# Patient Record
Sex: Female | Born: 1980 | Race: White | Hispanic: No | Marital: Married | Smoking: Never smoker
Health system: Southern US, Community
[De-identification: ages and names within clinical notes are randomized; demographics above are authoritative.]

## PROBLEM LIST (undated history)

## (undated) DIAGNOSIS — E109 Type 1 diabetes mellitus without complications: Secondary | ICD-10-CM

## (undated) HISTORY — DX: Type 1 diabetes mellitus without complications: E10.9

---
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
## 2016-10-27 ENCOUNTER — Inpatient Hospital Stay (HOSPITAL_COMMUNITY)
Admission: EM | Admit: 2016-10-27 | Discharge: 2016-10-30 | DRG: 638 | Disposition: A | Discharge status: Home / Self Care | Payer: 59 | Attending: Pulmonary Disease | Admitting: Pulmonary Disease

## 2016-10-27 ENCOUNTER — Emergency Department (HOSPITAL_COMMUNITY): Payer: 59

## 2016-10-27 DIAGNOSIS — D72829 Elevated white blood cell count, unspecified: Secondary | ICD-10-CM | POA: Diagnosis present

## 2016-10-27 DIAGNOSIS — E876 Hypokalemia: Secondary | ICD-10-CM | POA: Diagnosis present

## 2016-10-27 DIAGNOSIS — J9811 Atelectasis: Secondary | ICD-10-CM | POA: Diagnosis present

## 2016-10-27 DIAGNOSIS — E875 Hyperkalemia: Secondary | ICD-10-CM | POA: Diagnosis present

## 2016-10-27 DIAGNOSIS — E1110 Type 2 diabetes mellitus with ketoacidosis without coma: Secondary | ICD-10-CM | POA: Diagnosis present

## 2016-10-27 DIAGNOSIS — B9729 Other coronavirus as the cause of diseases classified elsewhere: Secondary | ICD-10-CM | POA: Diagnosis present

## 2016-10-27 DIAGNOSIS — R112 Nausea with vomiting, unspecified: Secondary | ICD-10-CM | POA: Diagnosis present

## 2016-10-27 DIAGNOSIS — N179 Acute kidney failure, unspecified: Secondary | ICD-10-CM | POA: Diagnosis present

## 2016-10-27 DIAGNOSIS — Z794 Long term (current) use of insulin: Secondary | ICD-10-CM | POA: Diagnosis not present

## 2016-10-27 DIAGNOSIS — R000 Tachycardia, unspecified: Secondary | ICD-10-CM | POA: Diagnosis present

## 2016-10-27 DIAGNOSIS — E1010 Type 1 diabetes mellitus with ketoacidosis without coma: Principal | ICD-10-CM | POA: Diagnosis present

## 2016-10-27 DIAGNOSIS — E861 Hypovolemia: Secondary | ICD-10-CM | POA: Diagnosis present

## 2016-10-27 DIAGNOSIS — R0609 Other forms of dyspnea: Secondary | ICD-10-CM

## 2016-10-27 DIAGNOSIS — R0600 Dyspnea, unspecified: Secondary | ICD-10-CM

## 2016-10-27 LAB — BLOOD GAS, VENOUS
ACID-BASE DEFICIT: 27.4 mmol/L — AB (ref 0.0–2.0)
Bicarbonate: 4.3 mmol/L — ABNORMAL LOW (ref 20.0–28.0)
O2 Saturation: 68.5 %
PCO2 VEN: 20 mmHg — AB (ref 44.0–60.0)
PH VEN: 6.967 — AB (ref 7.250–7.430)
Patient temperature: 98.6
pO2, Ven: 39.6 mmHg (ref 32.0–45.0)

## 2016-10-27 LAB — GLUCOSE, CAPILLARY: GLUCOSE-CAPILLARY: 241 mg/dL — AB (ref 65–99)

## 2016-10-27 LAB — RAPID URINE DRUG SCREEN, HOSP PERFORMED
Amphetamines: NOT DETECTED
Barbiturates: NOT DETECTED
Benzodiazepines: NOT DETECTED
Cocaine: NOT DETECTED
Opiates: NOT DETECTED
Tetrahydrocannabinol: NOT DETECTED

## 2016-10-27 LAB — CBC
HEMATOCRIT: 44.1 % (ref 36.0–46.0)
Hemoglobin: 13 g/dL (ref 12.0–15.0)
MCH: 30 pg (ref 26.0–34.0)
MCHC: 29.5 g/dL — ABNORMAL LOW (ref 30.0–36.0)
MCV: 101.8 fL — ABNORMAL HIGH (ref 78.0–100.0)
Platelets: 632 10*3/uL — ABNORMAL HIGH (ref 150–400)
RBC: 4.33 MIL/uL (ref 3.87–5.11)
RDW: 12.4 % (ref 11.5–15.5)
WBC: 57.2 10*3/uL — AB (ref 4.0–10.5)

## 2016-10-27 LAB — URINALYSIS, ROUTINE W REFLEX MICROSCOPIC
Bilirubin Urine: NEGATIVE
Ketones, ur: 80 mg/dL — AB
LEUKOCYTES UA: NEGATIVE
NITRITE: NEGATIVE
PH: 5 (ref 5.0–8.0)
PROTEIN: 30 mg/dL — AB
SPECIFIC GRAVITY, URINE: 1.02 (ref 1.005–1.030)

## 2016-10-27 LAB — HEPATIC FUNCTION PANEL
ALT: 31 U/L (ref 14–54)
AST: 44 U/L — AB (ref 15–41)
Albumin: 4.2 g/dL (ref 3.5–5.0)
Alkaline Phosphatase: 138 U/L — ABNORMAL HIGH (ref 38–126)
TOTAL PROTEIN: 8.3 g/dL — AB (ref 6.5–8.1)
Total Bilirubin: 1.5 mg/dL — ABNORMAL HIGH (ref 0.3–1.2)

## 2016-10-27 LAB — BASIC METABOLIC PANEL
ANION GAP: 19 — AB (ref 5–15)
BUN: 40 mg/dL — ABNORMAL HIGH (ref 6–20)
BUN: 28 mg/dL — ABNORMAL HIGH (ref 6–20)
CALCIUM: 8.2 mg/dL — AB (ref 8.9–10.3)
CHLORIDE: 107 mmol/L (ref 101–111)
CO2: <7 mmol/L — ABNORMAL LOW (ref 22–32)
CO2: 9 mmol/L — ABNORMAL LOW (ref 22–32)
Calcium: 7.6 mg/dL — ABNORMAL LOW (ref 8.9–10.3)
Chloride: 88 mmol/L — ABNORMAL LOW (ref 101–111)
Creatinine, Ser: 1.86 mg/dL — ABNORMAL HIGH (ref 0.44–1.00)
Creatinine, Ser: 1.29 mg/dL — ABNORMAL HIGH (ref 0.44–1.00)
GFR calc Af Amer: 39 mL/min — ABNORMAL LOW (ref >60)
GFR calc Af Amer: >60 mL/min (ref >60)
GFR, EST NON AFRICAN AMERICAN: 33 mL/min — AB (ref >60)
GFR, EST NON AFRICAN AMERICAN: 52 mL/min — AB (ref >60)
GLUCOSE: 244 mg/dL — AB (ref 65–99)
Glucose, Bld: 1041 mg/dL (ref 65–99)
POTASSIUM: 7.3 mmol/L — AB (ref 3.5–5.1)
POTASSIUM: 3.5 mmol/L (ref 3.5–5.1)
SODIUM: 119 mmol/L — AB (ref 135–145)
Sodium: 135 mmol/L (ref 135–145)

## 2016-10-27 LAB — CBG MONITORING, ED: Glucose-Capillary: 337 mg/dL — ABNORMAL HIGH (ref 65–99)

## 2016-10-27 LAB — I-STAT CHEM 8, ED
BUN: 55 mg/dL — AB (ref 6–20)
CHLORIDE: 95 mmol/L — AB (ref 101–111)
Calcium, Ion: 1.04 mmol/L — ABNORMAL LOW (ref 1.15–1.40)
Creatinine, Ser: 1.4 mg/dL — ABNORMAL HIGH (ref 0.44–1.00)
HCT: 51 % — ABNORMAL HIGH (ref 36.0–46.0)
HEMOGLOBIN: 17.3 g/dL — AB (ref 12.0–15.0)
POTASSIUM: 7.5 mmol/L — AB (ref 3.5–5.1)
Sodium: 118 mmol/L — CL (ref 135–145)
TCO2: 5 mmol/L (ref 0–100)

## 2016-10-27 LAB — I-STAT CG4 LACTIC ACID, ED
LACTIC ACID, VENOUS: 3.39 mmol/L — AB (ref 0.5–1.9)
Lactic Acid, Venous: 3.07 mmol/L (ref 0.5–1.9)

## 2016-10-27 LAB — POC URINE PREG, ED: PREG TEST UR: NEGATIVE

## 2016-10-27 LAB — BETA-HYDROXYBUTYRIC ACID

## 2016-10-27 LAB — LACTIC ACID, PLASMA: LACTIC ACID, VENOUS: 1.8 mmol/L (ref 0.5–1.9)

## 2016-10-27 MED ORDER — PIPERACILLIN-TAZOBACTAM 3.375 G IVPB
3.3750 g | Freq: Three times a day (TID) | INTRAVENOUS | Status: DC
  Administered 2016-10-28 – 2016-10-29 (×5): 3.375 g via INTRAVENOUS
  Filled 2016-10-27 (×5): qty 50

## 2016-10-27 MED ORDER — VANCOMYCIN HCL IN DEXTROSE 1-5 GM/200ML-% IV SOLN
1000.0000 mg | Freq: Once | INTRAVENOUS | Status: AC
  Administered 2016-10-27: 1000 mg via INTRAVENOUS
  Filled 2016-10-27: qty 200

## 2016-10-27 MED ORDER — SODIUM CHLORIDE 0.9 % IV SOLN
INTRAVENOUS | Status: AC
  Administered 2016-10-27: 21:00:00 via INTRAVENOUS

## 2016-10-27 MED ORDER — SODIUM BICARBONATE 8.4 % IV SOLN
50.0000 meq | Freq: Once | INTRAVENOUS | Status: AC
  Administered 2016-10-27: 50 meq via INTRAVENOUS
  Filled 2016-10-27: qty 50

## 2016-10-27 MED ORDER — PIPERACILLIN-TAZOBACTAM 3.375 G IVPB 30 MIN
3.3750 g | Freq: Once | INTRAVENOUS | Status: AC
  Administered 2016-10-27: 3.375 g via INTRAVENOUS
  Filled 2016-10-27: qty 50

## 2016-10-27 MED ORDER — INSULIN ASPART 100 UNIT/ML ~~LOC~~ SOLN
10.0000 [IU] | Freq: Once | SUBCUTANEOUS | Status: AC
  Administered 2016-10-27: 10 [IU] via INTRAVENOUS
  Filled 2016-10-27: qty 1

## 2016-10-27 MED ORDER — SODIUM CHLORIDE 0.9 % IV BOLUS (SEPSIS)
1000.0000 mL | Freq: Once | INTRAVENOUS | Status: AC
  Administered 2016-10-27: 1000 mL via INTRAVENOUS

## 2016-10-27 MED ORDER — STERILE WATER FOR INJECTION IV SOLN
INTRAVENOUS | Status: DC
  Administered 2016-10-27 – 2016-10-28 (×3): via INTRAVENOUS
  Filled 2016-10-27 (×6): qty 850

## 2016-10-27 MED ORDER — SODIUM BICARBONATE 8.4 % IV SOLN: 200.0000 meq | Freq: Once | INTRAVENOUS | Status: DC

## 2016-10-27 MED ORDER — LACTATED RINGERS IV BOLUS (SEPSIS): 1000.0000 mL | Freq: Once | INTRAVENOUS | Status: DC

## 2016-10-27 MED ORDER — HEPARIN SODIUM (PORCINE) 5000 UNIT/ML IJ SOLN
5000.0000 [IU] | Freq: Three times a day (TID) | INTRAMUSCULAR | Status: DC
  Administered 2016-10-28 – 2016-10-30 (×6): 5000 [IU] via SUBCUTANEOUS
  Filled 2016-10-27 (×6): qty 1

## 2016-10-27 MED ORDER — LACTATED RINGERS IV BOLUS (SEPSIS)
1000.0000 mL | Freq: Once | INTRAVENOUS | Status: DC
Start: 2016-10-27 — End: 2016-10-27
  Administered 2016-10-27: 1000 mL via INTRAVENOUS

## 2016-10-27 MED ORDER — SODIUM BICARBONATE 8.4 % IV SOLN
50.0000 meq | Freq: Once | INTRAVENOUS | Status: DC
  Filled 2016-10-27: qty 50

## 2016-10-27 MED ORDER — VANCOMYCIN HCL IN DEXTROSE 750-5 MG/150ML-% IV SOLN
750.0000 mg | Freq: Two times a day (BID) | INTRAVENOUS | Status: DC
  Administered 2016-10-28: 750 mg via INTRAVENOUS
  Filled 2016-10-27: qty 150

## 2016-10-27 MED ORDER — SODIUM CHLORIDE 0.9 % IV SOLN: INTRAVENOUS | Status: DC

## 2016-10-27 MED ORDER — SODIUM CHLORIDE 0.9 % IV BOLUS (SEPSIS): 1000.0000 mL | Freq: Once | INTRAVENOUS | Status: DC

## 2016-10-27 MED ORDER — STERILE WATER FOR INJECTION IV SOLN
INTRAVENOUS | Status: DC
  Filled 2016-10-27: qty 9.71

## 2016-10-27 MED ORDER — SODIUM CHLORIDE 0.9 % IV SOLN
INTRAVENOUS | Status: DC
  Administered 2016-10-27: 5.4 [IU]/h via INTRAVENOUS
  Filled 2016-10-27: qty 2.5

## 2016-10-27 MED ORDER — DEXTROSE-NACL 5-0.45 % IV SOLN
INTRAVENOUS | Status: DC
  Administered 2016-10-27 – 2016-10-28 (×2): via INTRAVENOUS

## 2016-10-27 MED ORDER — SODIUM CHLORIDE 0.9 % IV SOLN: INTRAVENOUS | Status: AC

## 2016-10-27 MED ORDER — SODIUM BICARBONATE 8.4 % IV SOLN
INTRAVENOUS | Status: AC
  Filled 2016-10-27: qty 50

## 2016-10-27 MED ORDER — SODIUM BICARBONATE 8.4 % IV SOLN
100.0000 meq | Freq: Once | INTRAVENOUS | Status: AC
  Administered 2016-10-27: 100 meq via INTRAVENOUS
  Filled 2016-10-27: qty 100

## 2016-10-27 MED ORDER — CALCIUM GLUCONATE 10 % IV SOLN
1.0000 g | Freq: Once | INTRAVENOUS | Status: AC
  Administered 2016-10-27: 1 g via INTRAVENOUS
  Filled 2016-10-27: qty 10

## 2016-10-27 MED ORDER — DEXTROSE-NACL 5-0.45 % IV SOLN: INTRAVENOUS | Status: DC

## 2016-10-27 NOTE — ED Triage Notes (Addendum)
Per EMS pt c/o hyperglycemia, tachypnea, polydipsia, weakness. Pt was positive for flu, been ill x 1.5 weeks, not eating much last few days. 400 mL IV NS administered.

## 2016-10-27 NOTE — ED Notes (Signed)
Attempted to call report for room 12[facility] RN unavailable at this time.

## 2016-10-27 NOTE — ED Notes (Signed)
Gave lactic to MD and RN.

## 2016-10-27 NOTE — ED Notes (Signed)
Md and RN got chem 8

## 2016-10-27 NOTE — ED Notes (Addendum)
Gave 2nd amp of bicarb per verbal order EDP

## 2016-10-27 NOTE — ED Provider Notes (Signed)
[facility]-EMERGENCY DEPT Provider Note   CSN: [id number] Arrival date & time: 10/27/16  1619     History   Chief Complaint No chief complaint on file.   HPI Kristin Owens is a 36 y.o. female.  The history is provided by the patient.  Hyperglycemia  Blood sugar level PTA:  High Severity:  Severe Onset quality:  Gradual Duration:  4 days Timing:  Constant Progression:  Worsening Chronicity:  New Diabetes status:  Controlled with insulin Current diabetic therapy:  BID subcutaneous insulin Context: noncompliance (per husband, possible) and recent illness (flu-like symptoms)   Relieved by:  Nothing Ineffective treatments:  None tried Associated symptoms: confusion, dehydration, fatigue, increased thirst, shortness of breath and weakness   Risk factors: hx of DKA (as a young woman but not since)     No past medical history on file.  There are no active problems to display for this patient.   No past surgical history on file.  OB History    No data available       Home Medications    Prior to Admission medications   Medication Sig Start Date End Date Taking? Authorizing Provider  insulin regular (NOVOLIN R) 250 units/2.24mL (100 units/mL) injection Inject 20 Units into the skin 3 (three) times daily before meals.   Yes Historical Provider, MD    Family History No family history on file.  Social History Social History  Substance Use Topics  . Smoking status: Not on file  . Smokeless tobacco: Not on file  . Alcohol use Not on file     Allergies   Patient has no known allergies.   Review of Systems Review of Systems  Constitutional: Positive for fatigue.  Respiratory: Positive for shortness of breath.   Endocrine: Positive for polydipsia.  Neurological: Positive for weakness.  Psychiatric/Behavioral: Positive for confusion.  All other systems reviewed and are negative.    Physical Exam Updated Vital Signs BP 99/70   Pulse 117   Temp 98.9 F (37.2  C) (Oral)   Resp (!) 38   Ht 5' 2.5" (1.588 m)   Wt 175 lb (79.4 kg)   LMP 10/02/2016 (Approximate)   SpO2 100%   BMI 31.50 kg/m   Physical Exam  Constitutional: She is oriented to person, place, and time. She appears well-developed. She appears toxic. She appears distressed.  HENT:  Head: Normocephalic.  Nose: Nose normal.  Mouth/Throat: Mucous membranes are dry.  Eyes: Conjunctivae are normal.  Neck: Neck supple. No tracheal deviation present.  Cardiovascular: Regular rhythm.  Tachycardia present.   Pulmonary/Chest: Tachypnea (kussmaul breathing) noted. She is in respiratory distress.  Abdominal: Soft. She exhibits no distension. There is no tenderness.  Neurological: She is alert and oriented to person, place, and time.  Skin: Skin is warm and dry. Capillary refill takes less than 2 seconds.  Psychiatric: She has a normal mood and affect.  Vitals reviewed.    ED Treatments / Results  Labs (all labs ordered are listed, but only abnormal results are displayed) Labs Reviewed  BASIC METABOLIC PANEL - Abnormal; Notable for the following:       Result Value   Sodium 119 (*)    Potassium 7.3 (*)    Chloride 88 (*)    CO2 <7 (*)    Glucose, Bld 1,041 (*)    BUN 40 (*)    Creatinine, Ser 1.86 (*)    Calcium 8.2 (*)    GFR calc non Af Amer 33 (*)  GFR calc Af Amer 39 (*)    All other components within normal limits  CBC - Abnormal; Notable for the following:    WBC 57.2 (*)    MCV 101.8 (*)    MCHC 29.5 (*)    Platelets 632 (*)    All other components within normal limits  URINALYSIS, ROUTINE W REFLEX MICROSCOPIC - Abnormal; Notable for the following:    Color, Urine STRAW (*)    Glucose, UA >=500 (*)    Hgb urine dipstick MODERATE (*)    Ketones, ur 80 (*)    Protein, ur 30 (*)    Bacteria, UA RARE (*)    Squamous Epithelial / LPF 0-5 (*)    All other components within normal limits  BLOOD GAS, VENOUS - Abnormal; Notable for the following:    pH, Ven 6.827 (*)     pO2, Ven 56.0 (*)    All other components within normal limits  BETA-HYDROXYBUTYRIC ACID - Abnormal; Notable for the following:    Beta-Hydroxybutyric Acid >8.00 (*)    All other components within normal limits  CBG MONITORING, ED - Abnormal; Notable for the following:    Glucose-Capillary >600 (*)    All other components within normal limits  I-STAT CHEM 8, ED - Abnormal; Notable for the following:    Sodium 118 (*)    Potassium 7.5 (*)    Chloride 95 (*)    BUN 55 (*)    Creatinine, Ser 1.40 (*)    Glucose, Bld >700 (*)    Calcium, Ion 1.04 (*)    Hemoglobin 17.3 (*)    HCT 51.0 (*)    All other components within normal limits  I-STAT CG4 LACTIC ACID, ED - Abnormal; Notable for the following:    Lactic Acid, Venous 3.07 (*)    All other components within normal limits  CULTURE, BLOOD (ROUTINE X 2)  CULTURE, BLOOD (ROUTINE X 2)  URINE CULTURE  HEPATIC FUNCTION PANEL  BLOOD GAS, VENOUS  POC URINE PREG, ED    EKG  EKG Interpretation  Date/Time:  Monday October 27 2016 17:39:37 EST Ventricular Rate:  116 PR Interval:    QRS Duration: 125 QT Interval:  339 QTC Calculation: 471 R Axis:   -15 Text Interpretation:  Age not entered, assumed to be  36 years old for purpose of ECG interpretation Sinus tachycardia IVCD, consider atypical RBBB Minimal ST elevation, lateral leads Baseline wander in lead(s) V6 No previous tracing Confirmed by Shomari Matusik MD, Courtlyn Aki [phone]) on 10/27/2016 5:45:15 PM       Radiology Dg Chest Port 1 View  Result Date: 10/27/2016 CLINICAL DATA:  Shortness breath. EXAM: PORTABLE CHEST 1 VIEW COMPARISON:  None. FINDINGS: The heart size is normal. Mild interstitial prominence is present without Kerley B-lines. Pulmonary artery size is within normal limits. There is no focal airspace disease. No effusions are present. The visualized soft tissues and bony thorax are unremarkable. IMPRESSION: 1. Mild interstitial prominence without focal airspace disease or  edema. This may reflect bronchitis. Electronically Signed   By: Marin Roberts M.D.   On: 10/27/2016 18:32    Procedures Procedures (including critical care time)  CRITICAL CARE Performed by: Lyndal Pulley Total critical care time: 75 minutes Critical care time was exclusive of separately billable procedures and treating other patients. Critical care was necessary to treat or prevent imminent or life-threatening deterioration. Critical care was time spent personally by me on the following activities: development of treatment plan with patient and/or surrogate  as well as nursing, discussions with consultants, evaluation of patient's response to treatment, examination of patient, obtaining history from patient or surrogate, ordering and performing treatments and interventions, ordering and review of laboratory studies, ordering and review of radiographic studies, pulse oximetry and re-evaluation of patient's condition.   Medications Ordered in ED Medications  insulin regular (NOVOLIN R,HUMULIN R) 250 Units in sodium chloride 0.9 % 250 mL (1 Units/mL) infusion (5.4 Units/hr Intravenous New Bag/Given 10/27/16 1742)  dextrose 5 %-0.45 % sodium chloride infusion (not administered)  lactated ringers bolus 1,000 mL (not administered)  piperacillin-tazobactam (ZOSYN) IVPB 3.375 g (3.375 g Intravenous New Bag/Given 10/27/16 1824)  vancomycin (VANCOCIN) IVPB 1000 mg/200 mL premix (not administered)  sodium bicarbonate 150 mEq in sterile water 1,000 mL infusion ( Intravenous New Bag/Given 10/27/16 1801)  sodium bicarbonate injection 100 mEq (not administered)  sodium chloride 0.9 % bolus 1,000 mL (0 mLs Intravenous Stopped 10/27/16 1828)    And  sodium chloride 0.9 % bolus 1,000 mL (0 mLs Intravenous Stopped 10/27/16 1828)  insulin aspart (novoLOG) injection 10 Units (10 Units Intravenous Given 10/27/16 1744)  sodium bicarbonate injection 50 mEq (50 mEq Intravenous Given 10/27/16 1757)     Initial  Impression / Assessment and Plan / ED Course  I have reviewed the triage vital signs and the nursing notes.  Pertinent labs & imaging results that were available during my care of the patient were reviewed by me and considered in my medical decision making (see chart for details).     36 y.o. female presents with weakness and hyperglycemia, she has kussmaul breathing on arrival and appears critically ill but oriented. Severely dehydrated. Aggressive fluid resuscitation with 3L crystalloid and IV insulin bolus and infusion with multiple bicarb boluses and bicarb infusion for severe metabolic acidosis of pH 6.82 from DKA and incalculably low CO2. Hyperkalemia without EKG changes will be treated with insulin and bicarb. Critical care consulted for critically ill patient requiring close attention for peri-arrest state. Covered broadly for sepsis of unknown etiology with WBC elevation that could be secondary to dehydration or suggest infection precipitating DKA. Pt required frequent reassessment and remained critically ill throughout her ED course.  Final Clinical Impressions(s) / ED Diagnoses   Final diagnoses:  Type 1 diabetes mellitus with ketoacidosis without coma (HCC)  Acute hyperkalemia    New Prescriptions New Prescriptions   No medications on file     Lyndal Pulleyaniel Elka Satterfield, MD 10/28/16 [phone]

## 2016-10-27 NOTE — H&P (Signed)
PULMONARY / CRITICAL CARE MEDICINE   Name: Kristin Owens MRN: [id number] DOB: 01-18-1981    ADMISSION DATE:  10/27/2016 CONSULTATION DATE:  10/27/16  REFERRING MD:  Clydene PughKnott - EDP  CHIEF COMPLAINT:  AMS  HISTORY OF PRESENT ILLNESS:   Kristin Owens is a 36 y.o. female with PMH of DM1.  She had been in her usual state of health up until roughly 3 days prior when she began to have N/V and decreased PO intake.  She thought that maybe she had the flu and due to decreased PO intake, she decreased her usual dosing of insulin.  On 02/19, he husband thought she was much worse; therefore, brought her to ED for further evaluation.  In ED, she was found to have severe DKA with venous pH of 6.8.  PCCM was subsequently called for admission.  PAST MEDICAL HISTORY :  She  has no past medical history on file.  PAST SURGICAL HISTORY: She  has no past surgical history on file.  No Known Allergies  No current facility-administered medications on file prior to encounter.    No current outpatient prescriptions on file prior to encounter.    FAMILY HISTORY:  Her has no family status information on file.    SOCIAL HISTORY: She    REVIEW OF SYSTEMS:   All negative; except for those that are bolded, which indicate positives.  Constitutional: weight loss, weight gain, night sweats, fevers, chills, fatigue, weakness.  HEENT: headaches, sore throat, sneezing, nasal congestion, post nasal drip, difficulty swallowing, tooth/dental problems, visual complaints, visual changes, ear aches. Neuro: difficulty with speech, weakness, numbness, ataxia. CV:  chest pain, orthopnea, PND, swelling in lower extremities, dizziness, palpitations, syncope.  Resp: cough, hemoptysis, dyspnea, wheezing. GI: heartburn, indigestion, abdominal pain, nausea, vomiting, diarrhea, constipation, change in bowel habits, loss of appetite, hematemesis, melena, hematochezia.  GU: dysuria, change in color of urine, urgency or frequency,  flank pain, hematuria. MSK: joint pain or swelling, decreased range of motion. Psych: change in mood or affect, depression, anxiety, suicidal ideations, homicidal ideations. Skin: rash, itching, bruising.   SUBJECTIVE:  Denies fevers/chills/sweats.  Asking for water to drink as mouth very dry.  VITAL SIGNS: BP 92/60 (BP Location: Left Arm)   Pulse (!) 123   Temp 98.9 F (37.2 C) (Oral)   Resp (!) 39   Ht 5' 2.5" (1.588 m)   Wt 79.4 kg (175 lb)   LMP 10/02/2016 (Approximate)   SpO2 100%   BMI 31.50 kg/m   HEMODYNAMICS:    VENTILATOR SETTINGS:    INTAKE / OUTPUT: No intake/output data recorded.   PHYSICAL EXAMINATION: General: Young caucasian female, in NAD. Neuro: A&O x 3, no focal deficits. HEENT: [place]/AT. PERRL, sclerae anicteric. Cardiovascular: Tachy, regular, no M/R/G.  Lungs: Respirations even and unlabored.  CTA bilaterally, No W/R/R. Abdomen: BS x 4, soft, NT/ND.  Musculoskeletal: No gross deformities, no edema.  Skin: Intact, warm, no rashes.  LABS:  BMET  Recent Labs Lab 10/27/16 1659 10/27/16 1714  NA 119* 118*  K 7.3* 7.5*  CL 88* 95*  CO2 <7*  --   BUN 40* 55*  CREATININE 1.86* 1.40*  GLUCOSE 1,041* >700*    Electrolytes  Recent Labs Lab 10/27/16 1659  CALCIUM 8.2*    CBC  Recent Labs Lab 10/27/16 1659 10/27/16 1714  WBC 57.2*  --   HGB 13.0 17.3*  HCT 44.1 51.0*  PLT 632*  --     Coag's No results for input(s): APTT, INR in the last  168 hours.  Sepsis Markers  Recent Labs Lab 10/27/16 1758  LATICACIDVEN 3.07*    ABG No results for input(s): PHART, PCO2ART, PO2ART in the last 168 hours.  Liver Enzymes  Recent Labs Lab 10/27/16 1817  AST 44*  ALT 31  ALKPHOS 138*  BILITOT 1.5*  ALBUMIN 4.2    Cardiac Enzymes No results for input(s): TROPONINI, PROBNP in the last 168 hours.  Glucose  Recent Labs Lab 10/27/16 1631 10/27/16 1850  GLUCAP >600* >600*    Imaging Dg Chest Port 1 View  Result Date:  10/27/2016 CLINICAL DATA:  Shortness breath. EXAM: PORTABLE CHEST 1 VIEW COMPARISON:  None. FINDINGS: The heart size is normal. Mild interstitial prominence is present without Kerley B-lines. Pulmonary artery size is within normal limits. There is no focal airspace disease. No effusions are present. The visualized soft tissues and bony thorax are unremarkable. IMPRESSION: 1. Mild interstitial prominence without focal airspace disease or edema. This may reflect bronchitis. Electronically Signed   By: Marin Roberts M.D.   On: 10/27/2016 18:32     STUDIES:  CXR 02/19 > no acute process.  CULTURES: Blood 02/19 > Urine 02/19 >  ANTIBIOTICS: Vanc 02/19 > Zosyn 02/19 >  SIGNIFICANT EVENTS: 02/19 > admit.  LINES/TUBES: None.  DISCUSSION: 36 y.o. female admitted with DKA after taking less insulin than usual due to decreased PO intake.  ASSESSMENT / PLAN:  ENDOCRINE A:   DKA - due to decreased insulin dosing given decreased PO intake.   P:   Insulin / fluids per DKA protocol.  RENAL A:   Pseudohyponatremia - corrects to ~134. Hyperkalemia - due to significant metabolic acidosis in setting DKA. AGMA + NAGMA - lactate + N/V. Hypocalcemia. P:   Fluids / insulin per DKA protocol. HCO3 gtt @ 150. 1g Ca gluconate. BMP now then q4hrs x 4.  INFECTIOUS A:   Significant leukocytosis - unclear etiology at this point. P:   Abx as above (vanc / zosyn).  Follow cultures as above. PCT algorithm to limit abx exposure.  PULMONARY A: Tachypnea - compensatory due to DKA. P:   No interventions required.  CARDIOVASCULAR A:  Sinus tachycardia - due to hypovolemia from DKA / decreased PO intake. P:  Continue IVF's.  GASTROINTESTINAL A:   Nutrition. P:   NPO - ice chips OK.  HEMATOLOGIC A:   VTE Prophylaxis. P:  SCD's / heparin. CBC in AM.  NEUROLOGIC A:   No acute issues. P:   No interventions required.  Family updated: None.  Interdisciplinary Family  Meeting v Palliative Care Meeting:  Due by: 11/03/16.  CC time: 30 min.   Rutherford Guys, [place] [facility] Pager: [phone]  or [phone] 10/27/2016, 8:06 PM  ATTENDING NOTE / ATTESTATION NOTE :   I have discussed the case with the resident/APP  Rutherford Guys PA.   I agree with the resident/APP's  history, physical examination, assessment, and plans.    I have edited the above note and modified it according to our agreed history, physical examination, assessment and plan.   Briefly, pt known to have type 1 DM since she was 36 yrs old.  On insulin N and insulin R BID.  Last time she had a DKA was when she was 36 yrs old.  Fairly healthy until 3-4 days ago.She started to have flu like sx with fevers, chills, nausea, vomiting, gend weakness, poor PO intake.  She has not really  been eating at all. Today, she was very confused so husband brought her to ED.  She was found to have severe DKA. VBG with pH of 6.8, PCO2 of below N range, PO2 56. Glucose was > 700 mg%, HCO3 was < 7. She was hydrated with saline and was started on insulin drip. By the time I  examined her, she is finishing her third liter of saline and is currently on 16 units of insulin per hour. According to the husband, she is more lucid but is still confused every now and then. Patient denies any subjective complaints.  Other medical history as mentioned above. No other significant history other than diabetes. She is married. Husband was at bedside. No children. She works for [employer]. Denies smoking. She drinks wine, beer every night.  Vitals:  Vitals:   10/27/16 1951 10/27/16 2000 10/27/16 2015 10/27/16 2100  BP:  97/57 97/57 106/61  Pulse: (!) 123 (!) 123 (!) 127   Resp: (!) 39 (!) 35 (!) 35 (!) 36  Temp:      TempSrc:      SpO2: 100% 100% 100%   Weight:      Height:        Constitutional/General: well-nourished, well-developed, comfortable, episodes of confusion, NAD  Body mass  index is 31.5 kg/m. Wt Readings from Last 3 Encounters:  10/27/16 79.4 kg (175 lb)    HEENT: PERLA, anicteric sclerae. (-) Oral thrush. Very dry oral mucosa  Neck: No masses. Midline trachea. No JVD, (-) LAD. (-) bruits appreciated.  Respiratory/Chest: Grossly normal chest. (-) deformity. (-) Accessory muscle use.  Symmetric expansion. Diminished BS on both lower lung zones. (-) wheezing, crackles, rhonchi (-) egophony  Cardiovascular: Regular rate and  rhythm, heart sounds normal, no murmur or gallops,  (-) edema Gastrointestinal:  Normal bowel sounds. Soft, non-tender. No hepatosplenomegaly.  (-) masses.   Musculoskeletal:  Normal muscle tone.   Extremities: Grossly normal. (-) clubbing, cyanosis.  (-) edema  Skin: (-) rash,lesions seen.   Neurological/Psychiatric :  CN grossly intact. (-) lateralizing signs. Answers questions appropriately. Follows commands. Occasional confusion.    CBC Recent Labs     10/27/16  1659  10/27/16  1714  WBC  57.2*   --   HGB  13.0  17.3*  HCT  44.1  51.0*  PLT  632*   --     Coag's No results for input(s): APTT, INR in the last 72 hours.  BMET Recent Labs     10/27/16  1659  10/27/16  1714  NA  119*  118*  K  7.3*  7.5*  CL  88*  95*  CO2  <7*   --   BUN  40*  55*  CREATININE  1.86*  1.40*  GLUCOSE  1,041*  >700*    Electrolytes Recent Labs     10/27/16  1659  CALCIUM  8.2*    Sepsis Markers No results for input(s): PROCALCITON, O2SATVEN in the last 72 hours.  Invalid input(s): LACTICACIDVEN  ABG No results for input(s): PHART, PCO2ART, PO2ART in the last 72 hours.  Liver Enzymes Recent Labs     10/27/16  1817  AST  44*  ALT  31  ALKPHOS  138*  BILITOT  1.5*  ALBUMIN  4.2    Cardiac Enzymes No results for input(s): TROPONINI, PROBNP in the last 72 hours.  Glucose Recent Labs     10/27/16  1631  10/27/16  1850  10/27/16  2007  GLUCAP  >  600*  >600*  >600*    Imaging Dg Chest Port 1  View  Result Date: 10/27/2016 CLINICAL DATA:  Shortness breath. EXAM: PORTABLE CHEST 1 VIEW COMPARISON:  None. FINDINGS: The heart size is normal. Mild interstitial prominence is present without Kerley B-lines. Pulmonary artery size is within normal limits. There is no focal airspace disease. No effusions are present. The visualized soft tissues and bony thorax are unremarkable. IMPRESSION: 1. Mild interstitial prominence without focal airspace disease or edema. This may reflect bronchitis. Electronically Signed   By: Marin Roberts M.D.   On: 10/27/2016 18:32    Assessment/Plan : Severe diabetes ketoacidosis. Etiology likely from recent viral infection and with poor by mouth intake. - By the time I saw the patient, she is finishing up her third liter of saline. She is currently also on bicarbonate infusion running at 125 ML's an hour. I ordered a 4th liter of saline to be given over 2 hours. I think she is very dry and she can tolerate a 5th and 6th L of saline. We need to reassess for congestion after the 4th L of saline - Continue DKA protocol. - Continue insulin drip. - Check electrolytes every 4 hours. - Admit to ICU. - Keep nothing by mouth.  AKI 2/2 hypovolemia - Continue with IV hydration. - Continue bicarbonate drip. - Check electrolytes every 4 hours.  Bronchitis. Rule out influenza. - She is being checked for influenza. - Need to check for respiratory viral panel - If she is influenza positive, I will begin treatment with Tamiflu.  Leukocytosis. Likely secondary to above. Concern for occult infection. - Panculture. - She was started on vancomycin and Zosyn. - Check pro calcitonin. - De-escalate within 24 hours if she is clinically improved.  Delirium 2/2 severe DKA -  check UDS and etoh level. According to the husband, she drinks every night. Watch out for withdrawals.    I spent  35  minutes of Critical Care time with this patient today. This is my time spent  independent of the APP or resident.   Family :Family updated at length today. I updated the husband at bedside.   Pollie Meyer, MD 10/27/2016, 9:48 PM [facility] Pager [phone] After 3 pm or if no answer, call [phone]

## 2016-10-27 NOTE — ED Notes (Signed)
Bed: RESA Expected date:  Expected time:  Means of arrival:  Comments:   Daryel Geraldatalie H Pegge Cumberledge, RN 10/27/16 1720

## 2016-10-27 NOTE — Progress Notes (Signed)
eLink Physician-Brief Progress Note Patient Name: Kristin Owens DOB: Feb 01, 1981 MRN: [id number]   Date of Service  10/27/2016  HPI/Events of Note  Multiple issues: 1. Venous blood gas = 6.967/20.0/39.6/27.4 and BP = 92/60 w/MAP = 69.  Currently on a NaHCO3 IV infusion.   eICU Interventions  Will order:  1. NaHCO3 200 meq IV now.  2. ABG at 9:00 PM. 3. 0.9 NaCl 1 liter IV over 1 hour now.      Intervention Category Major Interventions: Acid-Base disturbance - evaluation and management  [name] 10/27/2016, 7:54 PM

## 2016-10-27 NOTE — Progress Notes (Addendum)
Pharmacy Antibiotic Note  Kristin Owens is a 36 y.o. female admitted on 10/27/2016 with sepsis.  Pharmacy has been consulted for vancomycin/Zosyn dosing.  No H and P currently available   Plan:  Zosyn 3.375g IV q8h (4 hour infusion).   Vancomycin 1000 mg IV x1, then vancomycin 750 mg IV q12h  BMP ordered  Height: 5' 2.5" (158.8 cm) Weight: 175 lb (79.4 kg) IBW/kg (Calculated) : 51.25  Temp (24hrs), Avg:98.9 F (37.2 C), Min:98.9 F (37.2 C), Max:98.9 F (37.2 C)   Recent Labs Lab 10/27/16 1659 10/27/16 1714 10/27/16 1758  WBC 57.2*  --   --   CREATININE 1.86* 1.40*  --   LATICACIDVEN  --   --  3.07*    Estimated Creatinine Clearance: 53.8 mL/min (by C-G formula based on SCr of 1.4 mg/dL (H)).    No Known Allergies  Antimicrobials this admission: 2/19 Zosyn >>  2/19 vancomycin >>   Dose adjustments this admission: ---  Microbiology results: 2/19 BCx: sent 2/19 UCx: sent  2/19 HIV antibody:   Thank you for allowing pharmacy to be a part of this patient's care.  Adalberto ColeNikola Makahla Kiser, PharmD, BCPS Pager [phone] 10/27/2016 7:52 PM

## 2016-10-27 NOTE — ED Notes (Signed)
[facility] unable to take at [facility], Charge Nurse Elnita MaxwellCheryl will take report.

## 2016-10-28 ENCOUNTER — Encounter (HOSPITAL_COMMUNITY): Payer: Self-pay

## 2016-10-28 ENCOUNTER — Inpatient Hospital Stay (HOSPITAL_COMMUNITY): Payer: 59

## 2016-10-28 DIAGNOSIS — E1010 Type 1 diabetes mellitus with ketoacidosis without coma: Secondary | ICD-10-CM

## 2016-10-28 LAB — CBC WITH DIFFERENTIAL/PLATELET
BASOS PCT: 0 %
Basophils Absolute: 0 10*3/uL (ref 0.0–0.1)
EOS PCT: 0 %
Eosinophils Absolute: 0 10*3/uL (ref 0.0–0.7)
HEMATOCRIT: 31.8 % — AB (ref 36.0–46.0)
Hemoglobin: 11 g/dL — ABNORMAL LOW (ref 12.0–15.0)
LYMPHS ABS: 1.6 10*3/uL (ref 0.7–4.0)
Lymphocytes Relative: 6 %
MCH: 29.2 pg (ref 26.0–34.0)
MCHC: 34.6 g/dL (ref 30.0–36.0)
MCV: 84.4 fL (ref 78.0–100.0)
MONO ABS: 1.9 10*3/uL — AB (ref 0.1–1.0)
MONOS PCT: 7 %
NEUTROS ABS: 23.5 10*3/uL — AB (ref 1.7–7.7)
Neutrophils Relative %: 87 %
Platelets: 315 10*3/uL (ref 150–400)
RBC: 3.77 MIL/uL — ABNORMAL LOW (ref 3.87–5.11)
RDW: 12 % (ref 11.5–15.5)
WBC: 27 10*3/uL — ABNORMAL HIGH (ref 4.0–10.5)

## 2016-10-28 LAB — GLUCOSE, CAPILLARY
GLUCOSE-CAPILLARY: 112 mg/dL — AB (ref 65–99)
GLUCOSE-CAPILLARY: 112 mg/dL — AB (ref 65–99)
GLUCOSE-CAPILLARY: 129 mg/dL — AB (ref 65–99)
GLUCOSE-CAPILLARY: 132 mg/dL — AB (ref 65–99)
GLUCOSE-CAPILLARY: 148 mg/dL — AB (ref 65–99)
GLUCOSE-CAPILLARY: 151 mg/dL — AB (ref 65–99)
GLUCOSE-CAPILLARY: 171 mg/dL — AB (ref 65–99)
GLUCOSE-CAPILLARY: 171 mg/dL — AB (ref 65–99)
GLUCOSE-CAPILLARY: 214 mg/dL — AB (ref 65–99)
Glucose-Capillary: 129 mg/dL — ABNORMAL HIGH (ref 65–99)
Glucose-Capillary: 135 mg/dL — ABNORMAL HIGH (ref 65–99)
Glucose-Capillary: 147 mg/dL — ABNORMAL HIGH (ref 65–99)
Glucose-Capillary: 156 mg/dL — ABNORMAL HIGH (ref 65–99)
Glucose-Capillary: 163 mg/dL — ABNORMAL HIGH (ref 65–99)
Glucose-Capillary: 166 mg/dL — ABNORMAL HIGH (ref 65–99)
Glucose-Capillary: 182 mg/dL — ABNORMAL HIGH (ref 65–99)
Glucose-Capillary: 192 mg/dL — ABNORMAL HIGH (ref 65–99)
Glucose-Capillary: 196 mg/dL — ABNORMAL HIGH (ref 65–99)
Glucose-Capillary: 198 mg/dL — ABNORMAL HIGH (ref 65–99)
Glucose-Capillary: 209 mg/dL — ABNORMAL HIGH (ref 65–99)

## 2016-10-28 LAB — RESPIRATORY PANEL BY PCR
ADENOVIRUS-RVPPCR: NOT DETECTED
BORDETELLA PERTUSSIS-RVPCR: NOT DETECTED
CHLAMYDOPHILA PNEUMONIAE-RVPPCR: NOT DETECTED
Coronavirus 229E: NOT DETECTED
Coronavirus HKU1: NOT DETECTED
Coronavirus NL63: NOT DETECTED
Coronavirus OC43: DETECTED — AB
INFLUENZA A-RVPPCR: NOT DETECTED
INFLUENZA B-RVPPCR: NOT DETECTED
MYCOPLASMA PNEUMONIAE-RVPPCR: NOT DETECTED
Metapneumovirus: NOT DETECTED
PARAINFLUENZA VIRUS 4-RVPPCR: NOT DETECTED
Parainfluenza Virus 1: NOT DETECTED
Parainfluenza Virus 2: NOT DETECTED
Parainfluenza Virus 3: NOT DETECTED
Respiratory Syncytial Virus: NOT DETECTED
Rhinovirus / Enterovirus: NOT DETECTED

## 2016-10-28 LAB — BASIC METABOLIC PANEL
ANION GAP: 18 — AB (ref 5–15)
ANION GAP: 13 (ref 5–15)
Anion gap: 12 (ref 5–15)
BUN: 20 mg/dL (ref 6–20)
BUN: 15 mg/dL (ref 6–20)
BUN: 11 mg/dL (ref 6–20)
CALCIUM: 7.5 mg/dL — AB (ref 8.9–10.3)
CO2: 13 mmol/L — ABNORMAL LOW (ref 22–32)
CO2: 20 mmol/L — ABNORMAL LOW (ref 22–32)
CO2: 22 mmol/L (ref 22–32)
Calcium: 7.5 mg/dL — ABNORMAL LOW (ref 8.9–10.3)
Calcium: 7.8 mg/dL — ABNORMAL LOW (ref 8.9–10.3)
Chloride: 104 mmol/L (ref 101–111)
Chloride: 103 mmol/L (ref 101–111)
Chloride: 103 mmol/L (ref 101–111)
Creatinine, Ser: 0.83 mg/dL (ref 0.44–1.00)
Creatinine, Ser: 0.93 mg/dL (ref 0.44–1.00)
Creatinine, Ser: 0.82 mg/dL (ref 0.44–1.00)
GFR calc Af Amer: >60 mL/min (ref >60)
GLUCOSE: 130 mg/dL — AB (ref 65–99)
Glucose, Bld: 138 mg/dL — ABNORMAL HIGH (ref 65–99)
Glucose, Bld: 194 mg/dL — ABNORMAL HIGH (ref 65–99)
POTASSIUM: 3.9 mmol/L (ref 3.5–5.1)
POTASSIUM: 2.8 mmol/L — AB (ref 3.5–5.1)
Potassium: 2.6 mmol/L — CL (ref 3.5–5.1)
SODIUM: 135 mmol/L (ref 135–145)
SODIUM: 136 mmol/L (ref 135–145)
SODIUM: 137 mmol/L (ref 135–145)

## 2016-10-28 LAB — BLOOD GAS, ARTERIAL
Acid-base deficit: 17.1 mmol/L — ABNORMAL HIGH (ref 0.0–2.0)
Bicarbonate: 8.4 mmol/L — ABNORMAL LOW (ref 20.0–28.0)
DRAWN BY: 11249
FIO2: 0.21
O2 SAT: 98.1 %
PCO2 ART: 18.7 mmHg — AB (ref 32.0–48.0)
Patient temperature: 97.7
pH, Arterial: 7.27 — ABNORMAL LOW (ref 7.350–7.450)
pO2, Arterial: 105 mmHg (ref 83.0–108.0)

## 2016-10-28 LAB — BLOOD GAS, VENOUS
FIO2: 21
O2 Saturation: 80.2 %
Patient temperature: 98.6
pH, Ven: 6.827 — CL (ref 7.250–7.430)
pO2, Ven: 56 mmHg — ABNORMAL HIGH (ref 32.0–45.0)

## 2016-10-28 LAB — HIV ANTIBODY (ROUTINE TESTING W REFLEX): HIV SCREEN 4TH GENERATION: NONREACTIVE

## 2016-10-28 LAB — MAGNESIUM
MAGNESIUM: 1.5 mg/dL — AB (ref 1.7–2.4)
MAGNESIUM: 1.8 mg/dL (ref 1.7–2.4)
Magnesium: 1.7 mg/dL (ref 1.7–2.4)

## 2016-10-28 LAB — PHOSPHORUS
PHOSPHORUS: 1.8 mg/dL — AB (ref 2.5–4.6)
Phosphorus: 1.5 mg/dL — ABNORMAL LOW (ref 2.5–4.6)

## 2016-10-28 LAB — MRSA PCR SCREENING: MRSA BY PCR: NEGATIVE

## 2016-10-28 LAB — LACTIC ACID, PLASMA: Lactic Acid, Venous: 1.2 mmol/L (ref 0.5–1.9)

## 2016-10-28 LAB — ETHANOL

## 2016-10-28 LAB — PROCALCITONIN: PROCALCITONIN: 6.79 ng/mL

## 2016-10-28 MED ORDER — MAGNESIUM SULFATE IN D5W 1-5 GM/100ML-% IV SOLN
1.0000 g | Freq: Once | INTRAVENOUS | Status: AC
  Administered 2016-10-28: 1 g via INTRAVENOUS
  Filled 2016-10-28: qty 100

## 2016-10-28 MED ORDER — SODIUM CHLORIDE 0.9 % IV SOLN
30.0000 meq | INTRAVENOUS | Status: AC
  Administered 2016-10-28 (×2): 30 meq via INTRAVENOUS
  Filled 2016-10-28 (×2): qty 15

## 2016-10-28 MED ORDER — SODIUM CHLORIDE 0.9 % IV SOLN
INTRAVENOUS | Status: DC
  Administered 2016-10-28: 18:00:00 via INTRAVENOUS

## 2016-10-28 MED ORDER — VANCOMYCIN HCL IN DEXTROSE 1-5 GM/200ML-% IV SOLN
1000.0000 mg | Freq: Two times a day (BID) | INTRAVENOUS | Status: DC
  Administered 2016-10-28 – 2016-10-29 (×2): 1000 mg via INTRAVENOUS
  Filled 2016-10-28 (×2): qty 200

## 2016-10-28 MED ORDER — MAGNESIUM SULFATE 50 % IJ SOLN: 1.0000 g | Freq: Once | INTRAMUSCULAR | Status: DC

## 2016-10-28 MED ORDER — INSULIN ASPART 100 UNIT/ML ~~LOC~~ SOLN
2.0000 [IU] | SUBCUTANEOUS | Status: DC
  Administered 2016-10-28: 4 [IU] via SUBCUTANEOUS
  Administered 2016-10-28 – 2016-10-29 (×3): 2 [IU] via SUBCUTANEOUS

## 2016-10-28 MED ORDER — INSULIN GLARGINE 100 UNIT/ML ~~LOC~~ SOLN
35.0000 [IU] | SUBCUTANEOUS | Status: DC
  Administered 2016-10-28: 35 [IU] via SUBCUTANEOUS
  Filled 2016-10-28: qty 0.35

## 2016-10-28 MED ORDER — SODIUM CHLORIDE 0.9 % IV SOLN: 30.0000 meq | Freq: Three times a day (TID) | INTRAVENOUS | Status: DC

## 2016-10-28 MED ORDER — SODIUM PHOSPHATES 45 MMOLE/15ML IV SOLN
30.0000 mmol | Freq: Once | INTRAVENOUS | Status: AC
  Administered 2016-10-28: 30 mmol via INTRAVENOUS
  Filled 2016-10-28: qty 10

## 2016-10-28 NOTE — Progress Notes (Signed)
Pt's resp panel resulted for Coronavirus. Called infection prevention on call and they advised pt no longer needs to be on droplet precautions. Will continue to monitor pt closely.

## 2016-10-28 NOTE — Progress Notes (Signed)
PULMONARY / CRITICAL CARE MEDICINE   Name: Kristin Owens MRN: [id number] DOB: 1981/07/25    ADMISSION DATE:  10/27/2016 CONSULTATION DATE:  10/27/16  REFERRING MD:  Clydene PughKnott - EDP  CHIEF COMPLAINT:  AMS  BRIEF SUMMARY:  36 y/o F with DM I admitted 2/19 with a 3 day hx of nausea / vomiting and decreased PO intake.  Work up consistent with DKA.    SUBJECTIVE:  Pt reports feeling much better.  No vomiting since 0300.  Remains on Bicarb gtt.   VITAL SIGNS: BP 117/61   Pulse (!) 106   Temp 97.5 F (36.4 C) (Oral)   Resp (!) 23   Ht 5\' 2"  (1.575 m)   Wt 168 lb 14 oz (76.6 kg)   LMP 10/02/2016 (Approximate)   SpO2 98%   BMI 30.89 kg/m   HEMODYNAMICS:    VENTILATOR SETTINGS:    INTAKE / OUTPUT: I/O last 3 completed shifts: In: 4311.6 [I.V.:3651.6; IV Piggyback:660] Out: 2950 [Urine:2950]   PHYSICAL EXAMINATION: General: young adult female in NAD HEENT: MM pink/moist PSY: normal mood / affect Neuro: AAOx4, speech clear, MAE CV: s1s2 rrr, no m/r/g PULM: even/non-labored, lungs bilaterally clear NW:GNFAGI:soft, non-tender, bsx4 active  Extremities: warm/dry, no edema  Skin: no rashes or lesions   LABS:  BMET  Recent Labs Lab 10/27/16 2301 10/28/16 0339 10/28/16 0813  NA 135 135 136  K 3.5 3.9 2.8*  CL 107 104 103  CO2 9* 13* 20*  BUN 28* 20 15  CREATININE 1.29* 0.83 0.93  GLUCOSE 244* 138* 194*    Electrolytes  Recent Labs Lab 10/27/16 2301 10/28/16 0339 10/28/16 0813  CALCIUM 7.6* 7.5* 7.8*  MG  --  1.5* 1.7  PHOS  --  <1.0* 1.5*    CBC  Recent Labs Lab 10/27/16 1659 10/27/16 1714 10/28/16 0813  WBC 57.2*  --  27.0*  HGB 13.0 17.3* 11.0*  HCT 44.1 51.0* 31.8*  PLT 632*  --  315    Coag's No results for input(s): APTT, INR in the last 168 hours.  Sepsis Markers  Recent Labs Lab 10/27/16 2112 10/27/16 2301 10/28/16 0339  LATICACIDVEN 3.39* 1.8 1.2  PROCALCITON  --   --  6.79    ABG  Recent Labs Lab 10/27/16 2311  PHART 7.270*   PCO2ART CRITICAL RESULT CALLED TO, READ BACK BY AND VERIFIED WITH:  PO2ART 105    Liver Enzymes  Recent Labs Lab 10/27/16 1817  AST 44*  ALT 31  ALKPHOS 138*  BILITOT 1.5*  ALBUMIN 4.2    Cardiac Enzymes No results for input(s): TROPONINI, PROBNP in the last 168 hours.  Glucose  Recent Labs Lab 10/28/16 0413 10/28/16 0521 10/28/16 0624 10/28/16 0720 10/28/16 0817 10/28/16 0929  GLUCAP 147* 171* 209* 214* 192* 198*    Imaging Dg Chest Port 1 View  Result Date: 10/28/2016 CLINICAL DATA:  Dyspnea. EXAM: PORTABLE CHEST 1 VIEW COMPARISON:  10/27/2016 FINDINGS: New hazy densities at the left lung base and there are increased densities along the left side of the mediastinum. Findings probably represent areas of volume loss based on the abrupt change from the recent comparison examination. Few densities at the right costophrenic angle or knee. Heart size is within normal limits. No evidence for pulmonary edema. IMPRESSION: Increased densities at the left lung base and along the medial left chest. Findings probably represent areas of volume loss and atelectasis. Electronically Signed   By: Richarda OverlieAdam  Henn M.D.   On: 10/28/2016 07:31   Dg  Chest Port 1 View  Result Date: 10/27/2016 CLINICAL DATA:  Shortness breath. EXAM: PORTABLE CHEST 1 VIEW COMPARISON:  None. FINDINGS: The heart size is normal. Mild interstitial prominence is present without Kerley B-lines. Pulmonary artery size is within normal limits. There is no focal airspace disease. No effusions are present. The visualized soft tissues and bony thorax are unremarkable. IMPRESSION: 1. Mild interstitial prominence without focal airspace disease or edema. This may reflect bronchitis. Electronically Signed   By: Marin Roberts M.D.   On: 10/27/2016 18:32     STUDIES:  CXR 02/19 > no acute process.  CULTURES: Blood 02/19 > Urine 02/19 > RVP 2/19 > Flu 2/19 >  ANTIBIOTICS: Vanc 02/19 > Zosyn 02/19 >  SIGNIFICANT  EVENTS: 2/19 Admit with N/V, DKA. R/O flu  LINES/TUBES: None.  DISCUSSION: 36 y.o. female admitted with DKA after taking less insulin than usual due to decreased PO intake.  ASSESSMENT / PLAN:  ENDOCRINE A:   DKA - due to decreased insulin dosing given decreased PO intake.   P:   DKA protocol with insulin gtt  Monitor glucose trend  RENAL A:   Pseudohyponatremia - corrects to ~134. Hyperkalemia - due to significant metabolic acidosis in setting DKA. AGMA + NAGMA - lactate + N/V. Hypocalcemia. Hypomagnesemia  P:   Continue IVF per DKA protocol  Monitor BMP Q4 for 3 additional  Discontinue bicarbonate gtt, monitor AG closely Hold further ABG's  INFECTIOUS A:   Significant leukocytosis - unclear etiology at this point. P:   Trend CBC  Follow cultures / viral studies  Trend PCT  Continue vanco / zosyn for now   PULMONARY A: Tachypnea - compensatory due to DKA. Left Basilar Atelectasis - monitor for infiltrate development P:   Pulmonary hygiene - IS, mobilize  CARDIOVASCULAR A:  Sinus tachycardia - due to hypovolemia from DKA / decreased PO intake. P:  Continue IVF per DKA protocol   GASTROINTESTINAL A:   Nutrition. P:   NPO except ice chips   HEMATOLOGIC A:   VTE Prophylaxis. P:  Trend CBC Heparin for DVT prophylaxis    Family updated: Patient updated on plan of care.  No family available am 2/20.    Interdisciplinary Family Meeting v Palliative Care Meeting:  Due by: 11/03/16.  CC Time: 30 minutes   Canary Brim, NP-C [facility] Pgr: [phone] or if no answer [phone] 10/28/2016, 11:52 AM   Attending Note:  I have examined patient, reviewed labs, studies and notes. I have discussed the case with B Ollis, and I agree with the data and plans as amended above. 36 year old woman with a history of diabetes I, who was well until she had acute onset of nausea and vomiting decreased by mouth intake about 3 days prior to  admission. She presented with weakness. Her evaluation was consistent with DKA. Prodrome consistent with possible influenza. She remains on isolation for this. Empiric anabiotic's were also started in case she had a bacterial infection inciting her decompensation. Currently on an insulin drip, bicarbonate drip, aggressive IV fluids. Collection lites have been abnormal and have been aggressively repleted. On my evaluation this morning she was significantly improved, awake, comfortable. Her blood pressure had normalized. Lungs were clear and heart exam was normal. She had trace pretibial edema. We will continue her insulin drip, stop the bicarbonate drip. Follow her BMP and plan to wean off insulin when her anion gap is closed and her bicarbonate is stable greater than 20. Continue empiric anabiotic's  for now that low threshold to discontinue depending on culture data. Continue proper precautions as I am still suspicious that she may have influenza.  Independent critical care time is 35 minutes.   Levy Pupa, MD, PhD 10/28/2016, 5:07 PM [facility] [phone] or if no answer [phone]

## 2016-10-28 NOTE — Progress Notes (Signed)
eLink Physician-Brief Progress Note Patient Name: Weston Annamily Weaver DOB: [birth year] MRN: [id number]   Date of Service  10/28/2016  HPI/Events of Note  Phos < 1, Mg 1.5 K has improved to 3.9  eICU Interventions  Replete with Phos and Mg with Na Phos and Mg sulphate. Follow repeat labs     Intervention Category Major Interventions: Electrolyte abnormality - evaluation and management  Najmah Carradine 10/28/2016, 5:10 AM

## 2016-10-28 NOTE — Progress Notes (Signed)
eLink Physician-Brief Progress Note Patient Name: Weston Annamily Mott DOB: Aug 11, 1981 MRN: [id number]   Date of Service  10/28/2016  HPI/Events of Note  AG resolved  eICU Interventions  Phase 3 orders  3.5 u insulin gtt = 35 u lantus Allow diet     Intervention Category Major Interventions: Hyperglycemia - active titration of insulin therapy  Nishtha Raider V. 10/28/2016, 3:42 PM

## 2016-10-28 NOTE — Progress Notes (Signed)
CRITICAL VALUE ALERT  Critical value received:  Potassium 2.6  Date of notification:  10/28/2016  Time of notification:  1352  Critical value read back: yes  Nurse who received alert:  P. Reola CalkinsBeck   MD notified (1st page):  Canary BrimBrandi Ollis  Time of first page:  1400  Responding MD:  Canary BrimBrandi Ollis  Time MD responded:  [phone]

## 2016-10-28 NOTE — Progress Notes (Signed)
eLink Physician-Brief Progress Note Patient Name: Kristin Owens DOB: 06-14-1981 MRN: [id number]   Date of Service  10/28/2016  HPI/Events of Note  Repeat ABG 7.27/19/105 with bicarb 8.4. Pt is comfortable on cam check  eICU Interventions  Continue insulin per DKA protocol Continue bicarb drip for now. Recheck ABG in AM.     Intervention Category Major Interventions: Acid-Base disturbance - evaluation and management  Mellody Masri 10/28/2016, 12:01 AM

## 2016-10-28 NOTE — Progress Notes (Signed)
Spoke to Dr. Caren GriffinsMannum regarding continuous medications ordered. Dr. Caren GriffinsMannum advised to continue bicarb gtt until pt pH further resolves and to have D5 0.45%NS running per DKA protocol. Will continue to monitor.

## 2016-10-28 NOTE — Care Management Note (Signed)
Case Management Note  Patient Details  Name: Kristin Owens MRN: [id number] Date of Birth: 05-26-1981  Subjective/Objective:              Hyper glycemia and hypotension and sepsis      Action/Plan:  home Date:  October 28, 2016 Chart reviewed for concurrent status and case management needs. Will continue to follow patient progress. Discharge Planning: following for needs Expected discharge date: [id number] Marcelle SmilingRhonda Abir Craine, BSN, [place]3, [place]   [phone]  Expected Discharge Date:   (unknown)               Expected Discharge Plan:  Home/Self Care  In-House Referral:     Discharge planning Services     Post Acute Care Choice:    Choice offered to:     DME Arranged:    DME Agency:     HH Arranged:    HH Agency:     Status of Service:  In process, will continue to follow  If discussed at Long Length of Stay Meetings, dates discussed:    Additional Comments:  Golda AcreDavis, Dontavius Keim Lynn, RN 10/28/2016, 10:58 AM

## 2016-10-28 NOTE — Progress Notes (Signed)
CRITICAL VALUE ALERT  Critical value received:  Phos <1  Date of notification:  10/28/16  Time of notification:  0505  Critical value read back:Yes.    Nurse who received alert:  Rutha BouchardShelby Aveyah Greenwood, RN  MD notified (1st page):  Dr. Isaiah SergeMannam  Time of first page:  0507  MD notified (2nd page):  Time of second page:  Responding MD:  Dr. Isaiah SergeMannam  Time MD responded:  [phone]

## 2016-10-28 NOTE — Progress Notes (Signed)
Pharmacy Antibiotic Note  Kristin Owens is a 36 y.o. female admitted on 10/27/2016 with sepsis.  Pharmacy has been consulted for vancomycin/Zosyn dosing.  Today, 10/28/2016 Day #2 antibiotics Tmax 99 WBC elevated but improved 27k SCr improved 0.93, CrCl 78 ml/min   Plan:  Continue Zosyn 3.375gm IV q8h (4hr extended infusions)  Increase vancomycin from 750mg  IV q12h to 1g IV q12h  Follow up renal function & cultures, clinical course  Height: 5\' 2"  (157.5 cm) Weight: 168 lb 14 oz (76.6 kg) IBW/kg (Calculated) : 50.1  Temp (24hrs), Avg:98.2 F (36.8 C), Min:97.5 F (36.4 C), Max:99 F (37.2 C)   Recent Labs Lab 10/27/16 1659 10/27/16 1714 10/27/16 1758 10/27/16 2112 10/27/16 2301 10/28/16 0339 10/28/16 0813  WBC 57.2*  --   --   --   --   --  27.0*  CREATININE 1.86* 1.40*  --   --  1.29* 0.83 0.93  LATICACIDVEN  --   --  3.07* 3.39* 1.8 1.2  --     Estimated Creatinine Clearance: 78.6 mL/min (by C-G formula based on SCr of 0.93 mg/dL).    No Known Allergies  Antimicrobials this admission:  2/19 Zosyn >>  2/19 vancomycin >>   Dose adjustments this admission:  2/20 increase vanc from 750mg  q12h to 1g q12h for improved SCr  Microbiology results:  2/19 BCx: ngtd 2/19 UCx: sent  2/19 HIV antibody: 2/19 MRSA PCR: neg 2/19 Respiratory panel: sent   Thank you for allowing pharmacy to be a part of this patient's care.  Loralee PacasErin Zeric Baranowski, PharmD, BCPS Pager: [phone] 10/28/2016 10:51 AM

## 2016-10-29 ENCOUNTER — Encounter (HOSPITAL_COMMUNITY): Payer: Self-pay | Admitting: *Deleted

## 2016-10-29 LAB — BASIC METABOLIC PANEL
Anion gap: 11 (ref 5–15)
BUN: 6 mg/dL (ref 6–20)
CALCIUM: 7.5 mg/dL — AB (ref 8.9–10.3)
CO2: 24 mmol/L (ref 22–32)
CREATININE: 0.78 mg/dL (ref 0.44–1.00)
Chloride: 106 mmol/L (ref 101–111)
Glucose, Bld: 123 mg/dL — ABNORMAL HIGH (ref 65–99)
Potassium: 2.6 mmol/L — CL (ref 3.5–5.1)
SODIUM: 141 mmol/L (ref 135–145)

## 2016-10-29 LAB — GLUCOSE, CAPILLARY
GLUCOSE-CAPILLARY: 277 mg/dL — AB (ref 65–99)
GLUCOSE-CAPILLARY: 249 mg/dL — AB (ref 65–99)
GLUCOSE-CAPILLARY: 274 mg/dL — AB (ref 65–99)
GLUCOSE-CAPILLARY: 170 mg/dL — AB (ref 65–99)
Glucose-Capillary: 133 mg/dL — ABNORMAL HIGH (ref 65–99)
Glucose-Capillary: 255 mg/dL — ABNORMAL HIGH (ref 65–99)

## 2016-10-29 LAB — CBC WITH DIFFERENTIAL/PLATELET
BASOS PCT: 0 %
Basophils Absolute: 0 10*3/uL (ref 0.0–0.1)
EOS ABS: 0 10*3/uL (ref 0.0–0.7)
EOS PCT: 0 %
HEMATOCRIT: 28.6 % — AB (ref 36.0–46.0)
Hemoglobin: 9.8 g/dL — ABNORMAL LOW (ref 12.0–15.0)
Lymphocytes Relative: 16 %
Lymphs Abs: 2.3 10*3/uL (ref 0.7–4.0)
MCH: 28.4 pg (ref 26.0–34.0)
MCHC: 34.3 g/dL (ref 30.0–36.0)
MCV: 82.9 fL (ref 78.0–100.0)
MONO ABS: 0.9 10*3/uL (ref 0.1–1.0)
MONOS PCT: 6 %
Neutro Abs: 11.5 10*3/uL — ABNORMAL HIGH (ref 1.7–7.7)
Neutrophils Relative %: 78 %
Platelets: 257 10*3/uL (ref 150–400)
RBC: 3.45 MIL/uL — ABNORMAL LOW (ref 3.87–5.11)
RDW: 12.5 % (ref 11.5–15.5)
WBC: 14.7 10*3/uL — ABNORMAL HIGH (ref 4.0–10.5)

## 2016-10-29 LAB — URINE CULTURE

## 2016-10-29 LAB — PHOSPHORUS: PHOSPHORUS: 1.4 mg/dL — AB (ref 2.5–4.6)

## 2016-10-29 LAB — PROCALCITONIN: Procalcitonin: 4.41 ng/mL

## 2016-10-29 LAB — MAGNESIUM: MAGNESIUM: 1.9 mg/dL (ref 1.7–2.4)

## 2016-10-29 MED ORDER — INSULIN ASPART 100 UNIT/ML ~~LOC~~ SOLN
0.0000 [IU] | Freq: Every day | SUBCUTANEOUS | Status: DC
  Administered 2016-10-29: 2 [IU] via SUBCUTANEOUS

## 2016-10-29 MED ORDER — POTASSIUM PHOSPHATES 15 MMOLE/5ML IV SOLN
20.0000 meq | Freq: Once | INTRAVENOUS | Status: AC
  Administered 2016-10-29: 20 meq via INTRAVENOUS
  Filled 2016-10-29: qty 4.55

## 2016-10-29 MED ORDER — POTASSIUM CHLORIDE 20 MEQ PO PACK: 40.0000 meq | PACK | Freq: Once | ORAL | Status: DC

## 2016-10-29 MED ORDER — INSULIN GLARGINE 100 UNIT/ML ~~LOC~~ SOLN: 35.0000 [IU] | Freq: Every day | SUBCUTANEOUS | Status: DC

## 2016-10-29 MED ORDER — INSULIN ASPART 100 UNIT/ML ~~LOC~~ SOLN
0.0000 [IU] | Freq: Three times a day (TID) | SUBCUTANEOUS | Status: DC
  Administered 2016-10-29: 5 [IU] via SUBCUTANEOUS
  Administered 2016-10-29: 8 [IU] via SUBCUTANEOUS
  Administered 2016-10-30: 3 [IU] via SUBCUTANEOUS

## 2016-10-29 MED ORDER — INSULIN GLARGINE 100 UNIT/ML ~~LOC~~ SOLN
35.0000 [IU] | SUBCUTANEOUS | Status: DC
  Administered 2016-10-29: 35 [IU] via SUBCUTANEOUS
  Filled 2016-10-29 (×3): qty 0.35

## 2016-10-29 MED ORDER — POTASSIUM CHLORIDE 20 MEQ/15ML (10%) PO SOLN
40.0000 meq | Freq: Once | ORAL | Status: AC
  Administered 2016-10-29: 40 meq via ORAL
  Filled 2016-10-29: qty 30

## 2016-10-29 MED ORDER — INSULIN ASPART 100 UNIT/ML ~~LOC~~ SOLN
4.0000 [IU] | Freq: Three times a day (TID) | SUBCUTANEOUS | Status: DC
  Administered 2016-10-29 (×2): 4 [IU] via SUBCUTANEOUS

## 2016-10-29 NOTE — Discharge Summary (Signed)
Physician Discharge Summary  Patient ID: Kristin Owens MRN: [id number] DOB/AGE: 1980-09-23 36 y.o.  Admit date: 10/27/2016 Discharge date: 10/30/2016    Discharge Diagnoses:  DKA  DM Type I Pseudohyponatremia Hyperkalemia  AGMA + NAGMA  Lactic Acidosis  Hypocalcemia  Hypokalemia Hypomagnesemia  Leukocytosis  Tachypnea  Left Basilar Atelectasis  Tachycardia                                                                      DISCHARGE PLAN BY DIAGNOSIS     DKA  DM Type I  Discharge Plan: Resume prior home insulin regimen  Pt was not on long acting insulin at home prior to admit and prefers to continue previously prescribed regimen at discharge Follow up with PCP within one week for BMP  Pseudohyponatremia Hyperkalemia  AGMA + NAGMA  Lactic Acidosis  Hypocalcemia  Hypokalemia Hypomagnesemia   Discharge Plan: KCL 40 mEq this pm and then 20 mEq PO QD until follow up with PCP & recheck of BMP / K+  Leukocytosis   Discharge Plan: Resolve, no acute follow up at this time   Tachypnea  Left Basilar Atelectasis  Discharge Plan: Resolved, no acute follow up at this time   Tachycardia   Discharge Plan: Resolved, no acute follow up at this time                 DISCHARGE SUMMARY   Kristin Owens is a 36 y.o. y/o female with a PMH of DM-I, well controlled, who presented to [facility] on 10/27/16 with reports of nausea / vomiting and decreased PO intake.  She reported she had been feeling poorly for approximately three days prior to admit and felt as thought she had the flu.  She reported decreasing her usual dosing of insulin due to illness.  She worsened and her husband brought her to the ER for evaluation.  In the ER, she was found to have severe DKA with a pH of 6.8,  glucose > 1000 and AKI.  She was admitted for further evaluation per PCCM. The patient was aggressively hydrated with normal saline and placed on an insulin drip.  Serial BMP's were followed to monitor anion  gap.  The patient was screened for the flu / respiratory viruses.  PCR was positive for coronavirus.  The patient gradually improved and was able to be transitioned off IV insulin.  Course was complicated by electrolyte disturbances.  Bacterial cultures negative to date at time of discharge. The patient was medically cleared for discharge 2/22 with plans above.            SIGNIFICANT DIAGNOSTIC STUDIES 2/19  CXR >> mild interstitial prominence without focal airspace disease or edema  MICRO DATA  BCx2 2/19 >>  UC 2/19 >> multiple species RVP 2/19 >> coronavirus POSITIVE  ANTIBIOTICS Vancomycin 2/19 >> 2/21  Zosyn 2/19 >> 2/21   EVENTS 2/19  Admit with N/V, DKA 2/20  AG closed, insulin gtt transitioned to oral    Discharge Exam: General:  Well developed adult female in NAD HEENT: MM pink/moist PSY: normal mood / affect Neuro: AAOx4, speech clear, MAE CV: s1s2 rrr, no m/r/g PULM: even/non-labored, lungs bilaterally clear TS:VXBL, non-tender, bsx4 active  Extremities: warm/dry, no edema  Skin: no  rashes or lesions   Vitals:   10/29/16 1525 10/29/16 1814 10/29/16 2123 10/30/16 0536  BP: 128/78 132/87 (!) 134/91 135/89  Pulse: 96 93 (!) 107 85  Resp: _0 Temp: 99.3 F (37.4 C) 98.6 F (37 C) 99.3 F (37.4 C) 98.2 F (36.8 C)  TempSrc: Oral Oral Oral Oral  SpO2: 98% 99% 100% 99%  Weight:      Height:         Discharge Labs  BMET  Recent Labs Lab 10/28/16 0339 10/28/16 0813 10/28/16 1306 10/29/16 0342 10/30/16 0615  NA 135 136 137 141 142  K 3.9 2.8* 2.6* 2.6* 2.8*  CL 104 103 103 106 103  CO2 13* 20* _1 GLUCOSE 138* 194* 130* 123* 58*  BUN _2 CREATININE 0.83 0.93 0.82 0.78 0.62  CALCIUM 7.5* 7.8* 7.5* 7.5* 8.1*  MG 1.5* 1.7 1.8 1.9 1.9  PHOS <1.0* 1.5* 1.8* 1.4* 3.1    CBC  Recent Labs Lab 10/28/16 0813 10/29/16 0342 10/30/16 0506  HGB 11.0* 9.8* 11.1*  HCT 31.8* 28.6* 31.7*  WBC 27.0* 14.7* 8.9  PLT 315 257 158      Allergies as of 10/30/2016   No Known Allergies     Medication List    TAKE these medications   NOVOLIN R 250 units/2.27m (100 units/mL) injection Generic drug:  insulin regular Inject 20 Units into the skin 3 (three) times daily before meals.   potassium chloride SA 20 MEQ tablet Commonly known as:  K-DUR,KLOR-CON Take 2 tablets at 4pm on 2/22, then 1 tablet daily until follow up with primary MD for labs         Disposition:  Home.  No new home health needs identified at time of discharge.   Discharged Condition: EMirenda Baltazarhas met maximum benefit of inpatient care and is medically stable and cleared for discharge.  Patient is pending follow up as above.      Time spent on disposition:  35 minutes    Signed: BNoe Gens NP-C [facility] Pgr: [phone] Office: [phone]  RBaltazar Apo MD, PhD 10/30/2016, 10:31 AM [facility] [phone] if no answer [phone]

## 2016-10-29 NOTE — Progress Notes (Addendum)
CRITICAL VALUE ALERT  Critical value received:  Potassium 2.6  Date of notification: 10/29/16  Time of notification:  1150  Critical value read back: yes   Nurse who received alert:  Beatrice LecherKim Izak Anding   MD notified (1st page):  Canary BrimBrandi Ollis, NP   Time of first page:  1150  MD notified (2nd page):  Time of second page:  Responding MD:  Canary BrimBrandi Ollis, NP Time MD responded: 1150

## 2016-10-29 NOTE — Progress Notes (Signed)
PULMONARY / CRITICAL CARE MEDICINE   Name: Kristin Owens MRN: [id number] DOB: 04/23/1981    ADMISSION DATE:  10/27/2016 CONSULTATION DATE:  10/27/16  REFERRING MD:  Clydene PughKnott - EDP  CHIEF COMPLAINT:  AMS  BRIEF SUMMARY:  36 y/o F with DM I admitted 2/19 with a 3 day hx of nausea / vomiting and decreased PO intake.  Work up consistent with DKA.    SUBJECTIVE:  RN reports AG closed, transitioned off insulin gtt late pm 2/21.  On lantus, CBG 277.  No acute events overnight.   VITAL SIGNS: BP 137/90 (BP Location: Left Arm)   Pulse (!) 108   Temp 98.3 F (36.8 C) (Oral)   Resp 13   Ht 5\' 2"  (1.575 m)   Wt 168 lb 14 oz (76.6 kg)   LMP 10/02/2016 (Approximate)   SpO2 98%   BMI 30.89 kg/m   HEMODYNAMICS:    VENTILATOR SETTINGS:    INTAKE / OUTPUT: I/O last 3 completed shifts: In: 7714.6 [P.O.:120; I.V.:5904.6; IV Piggyback:1690] Out: 6350 [Urine:6350]   PHYSICAL EXAMINATION: General: well developed adult female in NAD HEENT: MM pink/moist, no jvd  PSY: normal mood / affect Neuro: AAOx4, speech clear, MAE CV: s1s2 rrr, no m/r/g PULM: even/non-labored, lungs bilaterally clear  WU:JWJXGI:soft, non-tender, bsx4 active  Extremities: warm/dry, no edema  Skin: no rashes or lesions   LABS:  BMET  Recent Labs Lab 10/28/16 0339 10/28/16 0813 10/28/16 1306  NA 135 136 137  K 3.9 2.8* 2.6*  CL 104 103 103  CO2 13* 20* 22  BUN 20 15 11   CREATININE 0.83 0.93 0.82  GLUCOSE 138* 194* 130*    Electrolytes  Recent Labs Lab 10/28/16 0339 10/28/16 0813 10/28/16 1306 10/29/16 0342  CALCIUM 7.5* 7.8* 7.5*  --   MG 1.5* 1.7 1.8 1.9  PHOS <1.0* 1.5* 1.8* 1.4*    CBC  Recent Labs Lab 10/27/16 1659 10/27/16 1714 10/28/16 0813 10/29/16 0342  WBC 57.2*  --  27.0* 14.7*  HGB 13.0 17.3* 11.0* 9.8*  HCT 44.1 51.0* 31.8* 28.6*  PLT 632*  --  315 257    Coag's No results for input(s): APTT, INR in the last 168 hours.  Sepsis Markers  Recent Labs Lab 10/27/16 2112  10/27/16 2301 10/28/16 0339 10/29/16 0342  LATICACIDVEN 3.39* 1.8 1.2  --   PROCALCITON  --   --  6.79 4.41    ABG  Recent Labs Lab 10/27/16 2311  PHART 7.270*  PCO2ART 18.7*  PO2ART 105    Liver Enzymes  Recent Labs Lab 10/27/16 1817  AST 44*  ALT 31  ALKPHOS 138*  BILITOT 1.5*  ALBUMIN 4.2    Cardiac Enzymes No results for input(s): TROPONINI, PROBNP in the last 168 hours.  Glucose  Recent Labs Lab 10/28/16 1635 10/28/16 1808 10/28/16 1955 10/28/16 2335 10/29/16 0351 10/29/16 0912  GLUCAP 171* 182* 129* 132* 133* 277*    Imaging No results found.   STUDIES:  CXR 02/19 > no acute process.  CULTURES: Blood 02/19 > Urine 02/19 > multiple species RVP 2/19 > coronavirus positive  Flu 2/19 >  ANTIBIOTICS: Vanc 2/19 > 2/21 Zosyn 2/19 > 2/21  SIGNIFICANT EVENTS: 2/19 Admit with N/V, DKA. R/O flu  LINES/TUBES: None.  DISCUSSION: 36 y.o. female admitted with DKA after taking less insulin than usual due to decreased PO intake.  ASSESSMENT / PLAN:  ENDOCRINE A:   DKA - due to decreased insulin dosing given decreased PO intake.   P:  DKA protocol completed  Transition to AC/HS coverage > lantus 35 units QD, meal coverage and HS coverage Monitor glucose trend   RENAL A:   Pseudohyponatremia - corrects to ~134. Hyperkalemia - due to significant metabolic acidosis in setting DKA. AGMA + NAGMA - lactate + N/V. Hypocalcemia. Hypomagnesemia  P:   KVO IVF's Now BMP an in am KPhos IV 20 mEq x1 Replace electrolytes as indicated   INFECTIOUS A:   Significant leukocytosis - unclear etiology at this point. P:   Trend CBC  Follow cultures / viral studies Discontinue ABX 2/21   PULMONARY A: Tachypnea - compensatory due to DKA. Left Basilar Atelectasis - monitor for infiltrate development P:   Pulmonary hygiene   CARDIOVASCULAR A:  Sinus tachycardia - due to hypovolemia from DKA / decreased PO intake.  Resolved  P:  KVO  IVF  GASTROINTESTINAL A:   Nutrition. P:   Carb modified diet as tolerated   HEMATOLOGIC A:   VTE Prophylaxis. P:  Heparin for DVT prophylaxis  Trend CBC   Family updated: patient updated on plan of care 2/21.      Interdisciplinary Family Meeting v Palliative Care Meeting:  Due by: 11/03/16.  Transition to medical floor, anticipate discharge in am 2/22.     Canary Brim, NP-C [facility] Pgr: [phone] or if no answer [phone] 10/29/2016, 10:17 AM  Attending Note:  I have examined patient, reviewed labs, studies and notes. I have discussed the case with B Ollis, and I agree with the data and plans as amended above. 36 year old woman type 1 diabetes admitted with DKA in the setting of coronavirus an acute viral syndrome. She is improved, is now off an insulin drip. She is able to take by mouth. On evaluation today she is much more comfortable. Normal exam. Insulin gtt is off. We will plan to d/c her abx today. Get her back on her usual insulin routine. Likely home 2/21.   Levy Pupa, MD, PhD 10/29/2016, 4:50 PM  [facility] [phone] or if no answer [phone]

## 2016-10-30 LAB — PHOSPHORUS: Phosphorus: 3.1 mg/dL (ref 2.5–4.6)

## 2016-10-30 LAB — CBC WITH DIFFERENTIAL/PLATELET
Basophils Absolute: 0 K/uL (ref 0.0–0.1)
Basophils Relative: 0 %
Eosinophils Absolute: 0 K/uL (ref 0.0–0.7)
Eosinophils Relative: 0 %
HCT: 31.7 % — ABNORMAL LOW (ref 36.0–46.0)
Hemoglobin: 11.1 g/dL — ABNORMAL LOW (ref 12.0–15.0)
Lymphocytes Relative: 37 %
Lymphs Abs: 3.3 K/uL (ref 0.7–4.0)
MCH: 29.5 pg (ref 26.0–34.0)
MCHC: 35 g/dL (ref 30.0–36.0)
MCV: 84.3 fL (ref 78.0–100.0)
Monocytes Absolute: 0.6 K/uL (ref 0.1–1.0)
Monocytes Relative: 7 %
Neutro Abs: 5 K/uL (ref 1.7–7.7)
Neutrophils Relative %: 56 %
Platelets: 158 K/uL (ref 150–400)
RBC: 3.76 MIL/uL — ABNORMAL LOW (ref 3.87–5.11)
RDW: 13 % (ref 11.5–15.5)
WBC: 8.9 K/uL (ref 4.0–10.5)

## 2016-10-30 LAB — BASIC METABOLIC PANEL WITH GFR
Anion gap: 10 (ref 5–15)
BUN: 9 mg/dL (ref 6–20)
CO2: 29 mmol/L (ref 22–32)
Calcium: 8.1 mg/dL — ABNORMAL LOW (ref 8.9–10.3)
Chloride: 103 mmol/L (ref 101–111)
Creatinine, Ser: 0.62 mg/dL (ref 0.44–1.00)
GFR calc Af Amer: >60 mL/min
GFR calc non Af Amer: >60 mL/min
Glucose, Bld: 58 mg/dL — ABNORMAL LOW (ref 65–99)
Potassium: 2.8 mmol/L — ABNORMAL LOW (ref 3.5–5.1)
Sodium: 142 mmol/L (ref 135–145)

## 2016-10-30 LAB — MAGNESIUM: Magnesium: 1.9 mg/dL (ref 1.7–2.4)

## 2016-10-30 LAB — GLUCOSE, CAPILLARY
GLUCOSE-CAPILLARY: 200 mg/dL — AB (ref 65–99)
Glucose-Capillary: 56 mg/dL — ABNORMAL LOW (ref 65–99)
Glucose-Capillary: 186 mg/dL — ABNORMAL HIGH (ref 65–99)

## 2016-10-30 MED ORDER — INSULIN GLARGINE 100 UNIT/ML ~~LOC~~ SOLN: 28.0000 [IU] | Freq: Every day | SUBCUTANEOUS | Status: DC

## 2016-10-30 MED ORDER — POTASSIUM CHLORIDE CRYS ER 20 MEQ PO TBCR
40.0000 meq | EXTENDED_RELEASE_TABLET | Freq: Once | ORAL | Status: AC
  Administered 2016-10-30: 40 meq via ORAL
  Filled 2016-10-30: qty 2

## 2016-10-30 MED ORDER — POTASSIUM CHLORIDE CRYS ER 20 MEQ PO TBCR: EXTENDED_RELEASE_TABLET | ORAL | 0 refills | Status: AC

## 2016-10-30 MED ORDER — POTASSIUM CHLORIDE CRYS ER 20 MEQ PO TBCR: 40.0000 meq | EXTENDED_RELEASE_TABLET | ORAL | Status: DC

## 2016-10-30 NOTE — Progress Notes (Signed)
Notified Dr. Tyson AliasFeinstein concerning CBG of 56.  Gave orders to reduce Lantus from 35 units to 28 units.

## 2016-11-01 LAB — CULTURE, BLOOD (ROUTINE X 2)
Culture: NO GROWTH
Culture: NO GROWTH

## 2018-06-23 IMAGING — DX DG CHEST 1V PORT
1 series · 1 of 1 positions shown · non-contrast
Comparison: None.

CLINICAL DATA: Shortness breath.

EXAM:
PORTABLE CHEST 1 VIEW

[chest ap]
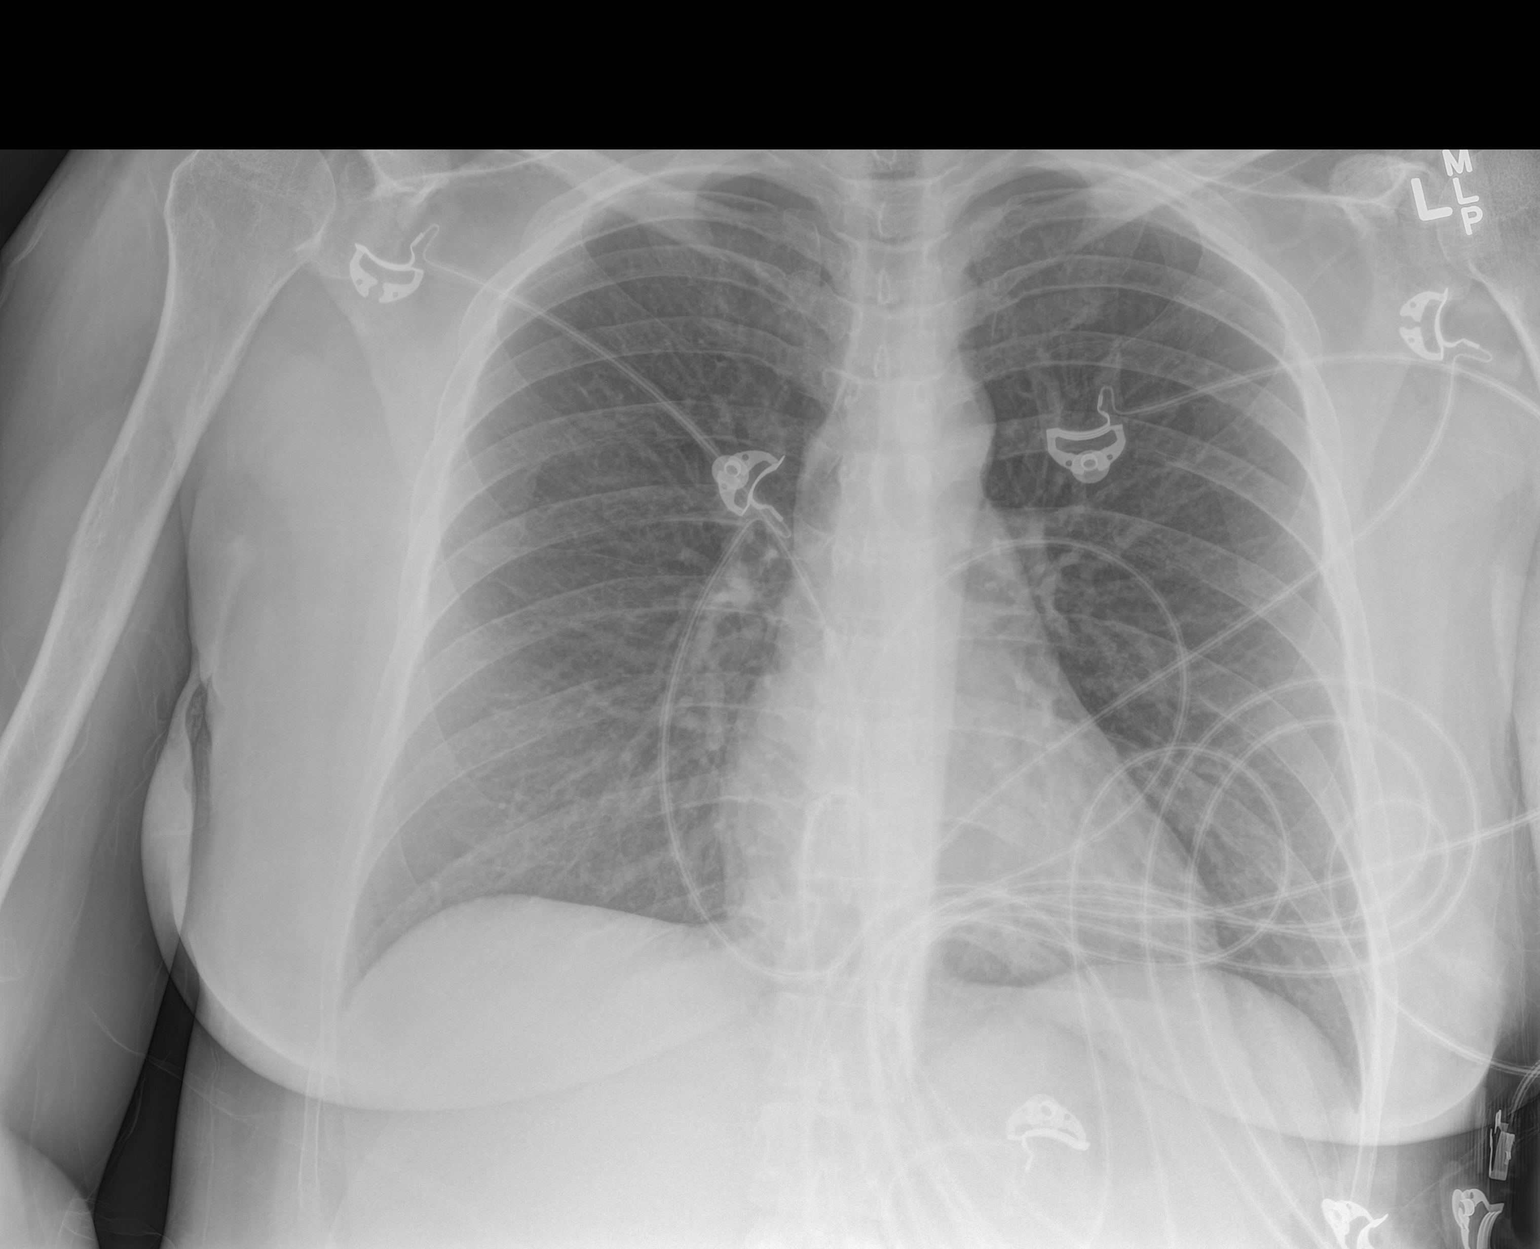

[1 of 1 positions shown; findings below may reference images not displayed]

FINDINGS: The heart size is normal. Mild interstitial prominence is present
without Kerley B-lines. Pulmonary artery size is within normal
limits. There is no focal airspace disease. No effusions are
present. The visualized soft tissues and bony thorax are
unremarkable.
IMPRESSION: 1. Mild interstitial prominence without focal airspace disease or
edema. This may reflect bronchitis.

## 2019-10-14 DIAGNOSIS — Z20828 Contact with and (suspected) exposure to other viral communicable diseases: Secondary | ICD-10-CM | POA: Diagnosis not present
